# Patient Record
Sex: Female | Born: 2000 | Race: Black or African American | Hispanic: No | Smoking: Never smoker
Health system: Southern US, Community
[De-identification: ages and names within clinical notes are randomized; demographics above are authoritative.]

## PROBLEM LIST (undated history)

## (undated) ENCOUNTER — Emergency Department (HOSPITAL_COMMUNITY): Admission: EM | Payer: Medicaid Other | Source: Home / Self Care

## (undated) DIAGNOSIS — F509 Eating disorder, unspecified: Secondary | ICD-10-CM

## (undated) DIAGNOSIS — E669 Obesity, unspecified: Secondary | ICD-10-CM

## (undated) DIAGNOSIS — E119 Type 2 diabetes mellitus without complications: Secondary | ICD-10-CM

## (undated) DIAGNOSIS — I10 Essential (primary) hypertension: Secondary | ICD-10-CM

## (undated) HISTORY — DX: Essential (primary) hypertension: I10

## (undated) HISTORY — DX: Type 2 diabetes mellitus without complications: E11.9

---
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
## 2022-09-11 ENCOUNTER — Encounter (INDEPENDENT_AMBULATORY_CARE_PROVIDER_SITE_OTHER): Payer: Self-pay
